# Patient Record
Sex: Male | Born: 1985 | Race: White | Hispanic: No | Marital: Single | Smoking: Current every day smoker
Health system: Southern US, Community
[De-identification: ages and names within clinical notes are randomized; demographics above are authoritative.]

## PROBLEM LIST (undated history)

## (undated) ENCOUNTER — Ambulatory Visit: Source: Home / Self Care

## (undated) DIAGNOSIS — C801 Malignant (primary) neoplasm, unspecified: Secondary | ICD-10-CM

---
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
## 2007-10-23 ENCOUNTER — Encounter (INDEPENDENT_AMBULATORY_CARE_PROVIDER_SITE_OTHER): Payer: Self-pay | Admitting: *Deleted

## 2007-10-23 ENCOUNTER — Observation Stay (HOSPITAL_COMMUNITY): Admission: EM | Admit: 2007-10-23 | Discharge: 2007-10-24 | Payer: Self-pay | Admitting: Emergency Medicine

## 2009-08-03 IMAGING — CT CT ABDOMEN W/ CM
1 of 3 series · 14 of 32 positions shown, 19 images · IV contrast (APPLIED)
Comparison: None

CT ABDOMEN

CLINICAL DATA: Right lower quadrant pain

CT ABDOMEN AND PELVIS WITH CONTRAST
TECHNIQUE: Multidetector CT imaging of the abdomen and pelvis was
performed using the standard protocol following bolus
administration of intravenous contrast.
Contrast: 100 ml [id number]

[Series 2: abd/pelv with 5.0 b31f st · axial · 0.65mm/px · z∈[-488,-98]mm · 14 of 88 slices shown, 19 images]
[im 5/88  soft-tissue]
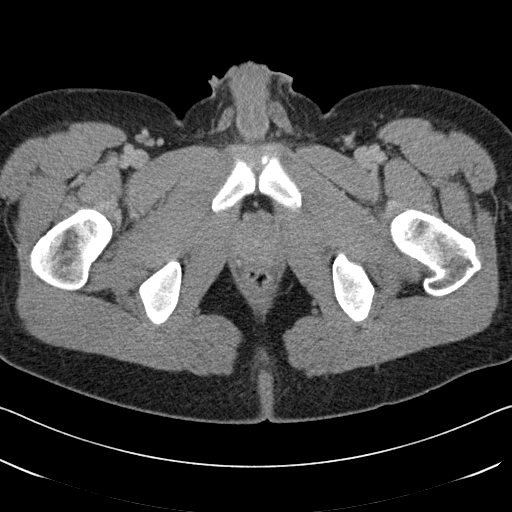
[im 5/88  bone]
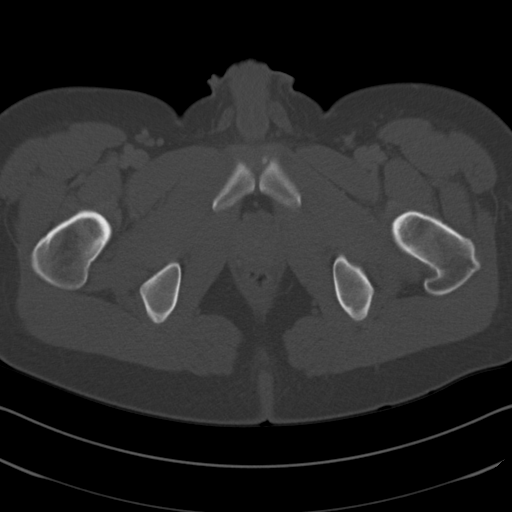
[im 14/88  soft-tissue]
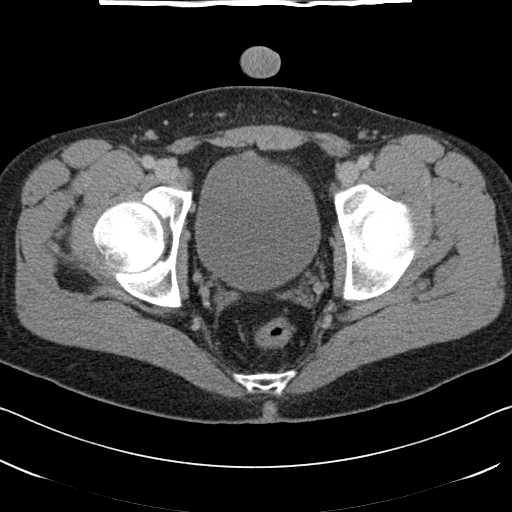
[im 19/88  soft-tissue]
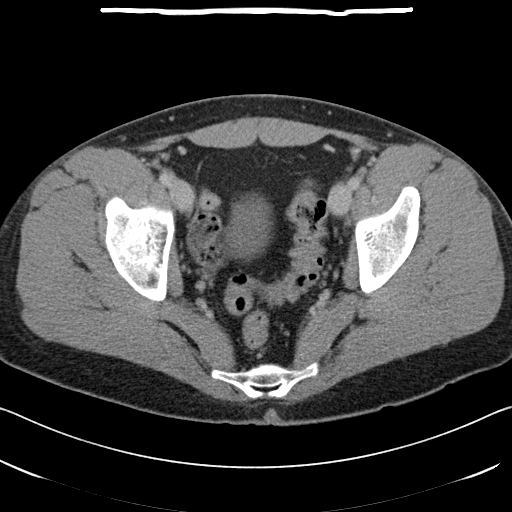
[im 23/88  soft-tissue]
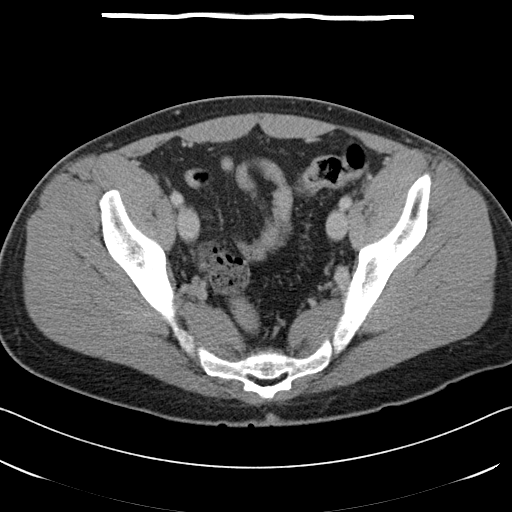
[im 33/88  soft-tissue]
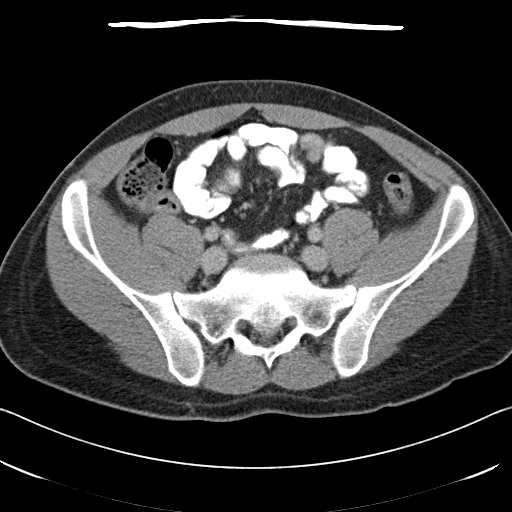
[im 37/88  soft-tissue]
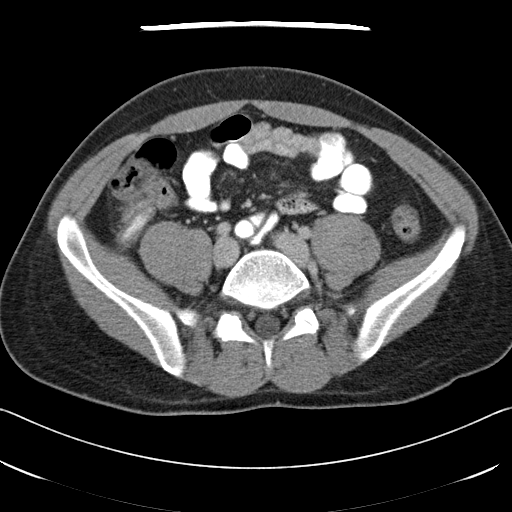
[im 46/88  soft-tissue]
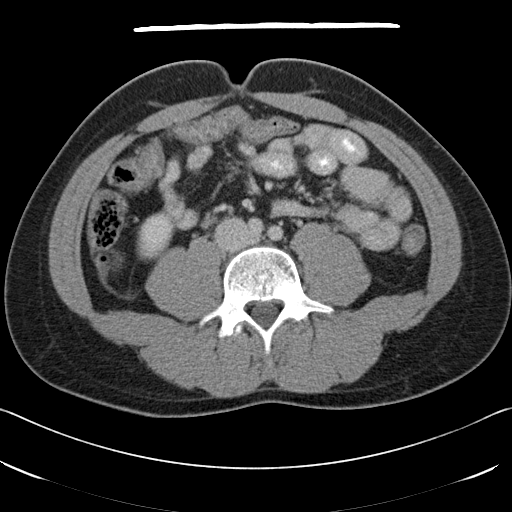
[im 51/88  soft-tissue]
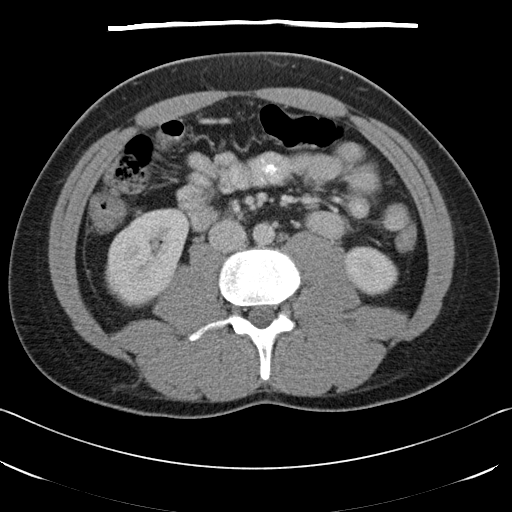
[im 55/88  soft-tissue]
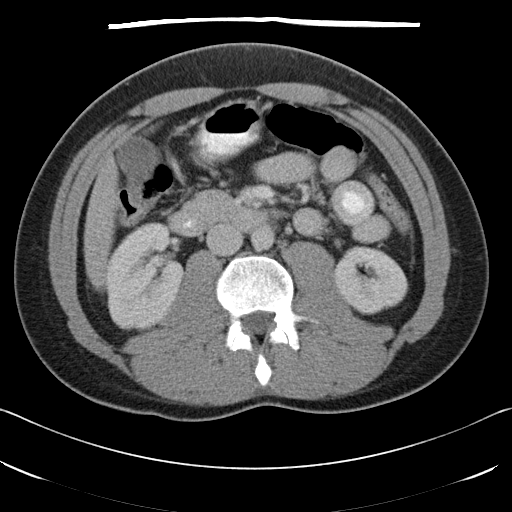
[im 55/88  bone]
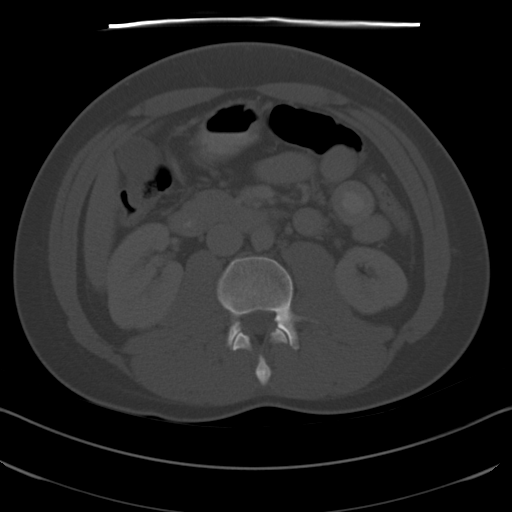
[im 65/88  soft-tissue]
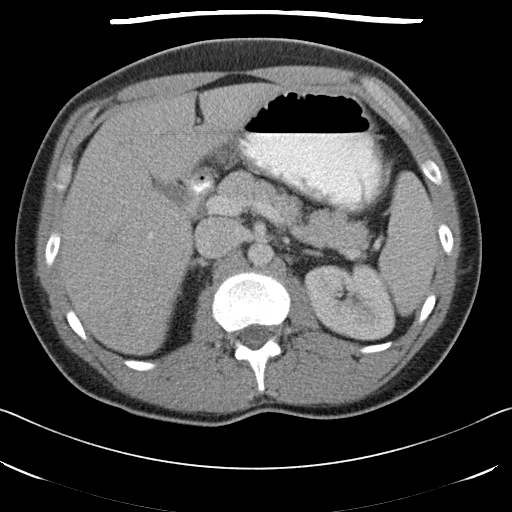
[im 69/88  soft-tissue]
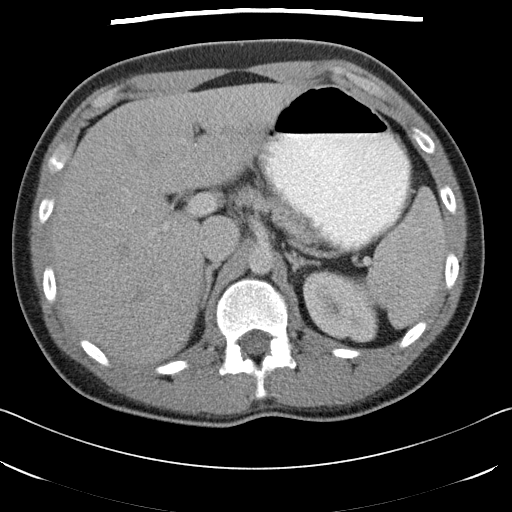
[im 69/88  lung]
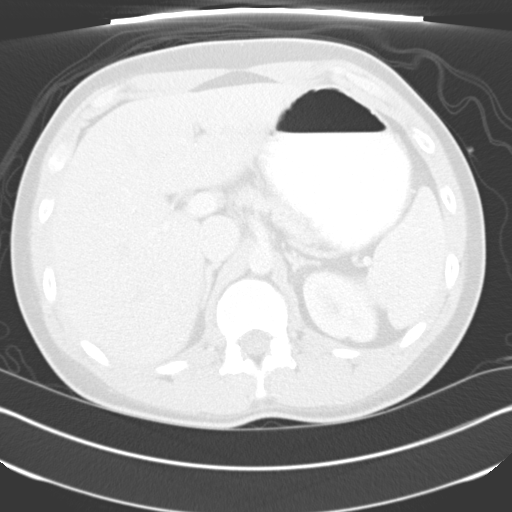
[im 74/88  soft-tissue]
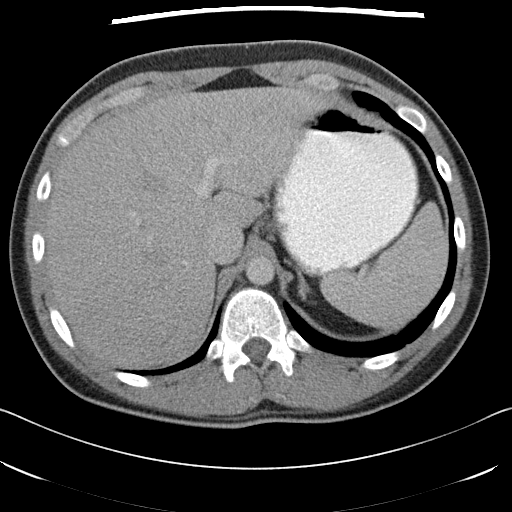
[im 74/88  lung]
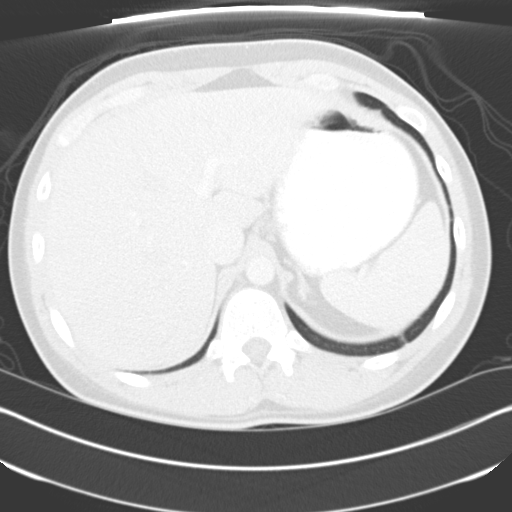
[im 78/88  lung]
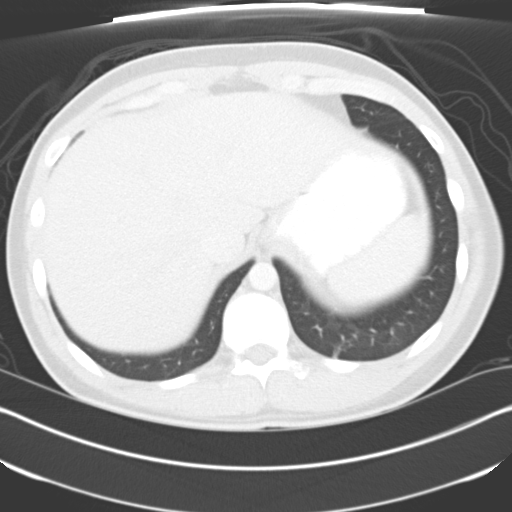
[im 83/88  soft-tissue]
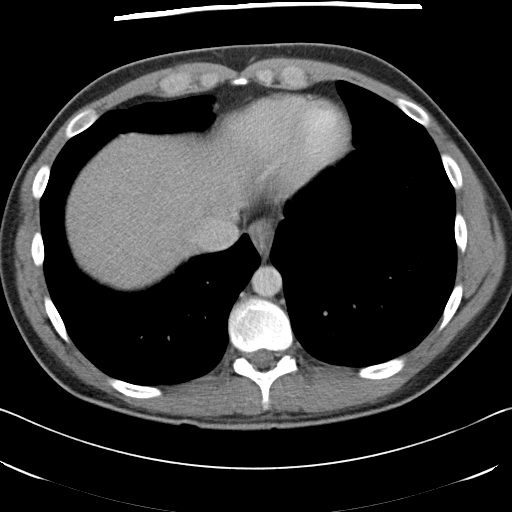
[im 83/88  lung]
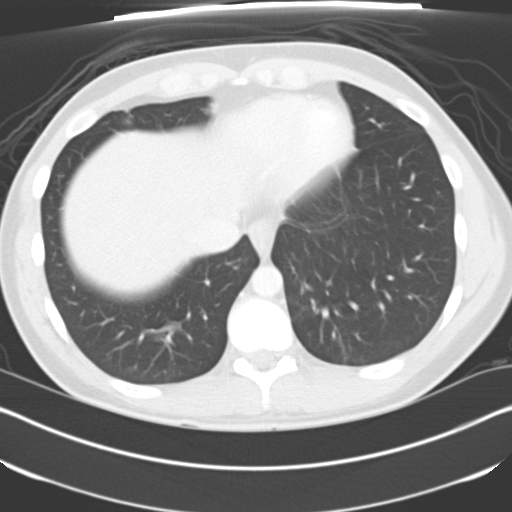

[14 of 32 positions shown; findings below may reference images not displayed]

FINDINGS: Lung bases are clear.  Liver, spleen, pancreas, and
adrenals normal.  No calcified gallstones or biliary dilatation.
No adenopathy or ascites.  Early and delayed images through the
kidneys normal.

The appendix is high in position.  It is thickened, enlarged, and
inflamed.  Within the there is radiodense material consistent with
appendicolith.  There is stranding in the periappendiceal fat but
no obvious perforation.  The findings are consistent with acute
appendicitis.
IMPRESSION: Acute appendicitis

CT PELVIS
FINDINGS: No focal masses, adenopathy, or fluid collections.  No
free air.
IMPRESSION: No acute or significant findings.

## 2010-10-27 NOTE — Op Note (Signed)
NAMEKHADEN, Travis Baird               ACCOUNT NO.:  [id number]   MEDICAL RECORD NO.:  [id number]          PATIENT TYPE:  INP   LOCATION:  5120                         FACILITY:  [facility]   PHYSICIAN:  Alfonse Ras, MD   DATE OF BIRTH:  [birth year]   DATE OF PROCEDURE:  10/23/2007  DATE OF DISCHARGE:                               OPERATIVE REPORT   PREOPERATIVE DIAGNOSIS:  Acute appendicitis.   POSTOPERATIVE DIAGNOSIS:  Acute appendicitis.   PROCEDURE:  Laparoscopic appendectomy.   ANESTHESIA:  General.   SURGEON:  Alfonse Ras, MD   INDICATIONS:  The patient is a 25 year old white male who presented to  the emergency room with acute appendicitis.  This was verified by CT  scan.  The patient also had an elevated white count.  He was prepared  for the operating room after the indication, risk, benefits, and options  were explained to the patient and his parents.   DESCRIPTION:  The patient was taken to the operating room and placed in  supine position.  After adequate general anesthesia was induced using  endotracheal tube, the abdomen was prepped and draped in normal sterile  fashion.  Using a transverse infraumbilical incision, I dissected down  to the fascia.  A vertical incision was made in the fascia.  An 0 Vicryl  pursestring suture was placed on the fascial defect.  Hassan trocar was  placed in the abdomen and pneumoperitoneum was obtained.  An additional  12-mm port was placed in the left lower quadrant and 5-mm port was  placed in the right upper abdomen.  This was done under direct vision.  The inflamed, enlarged appendix was easily visualized and was mobilized  off the peritoneal side wall.  The mesentery was taken down with a  harmonic scalpel and the base was transected using a blue load GIA 45-mm  stapler.  The right lower quadrant was copiously irrigated.  The  appendix was placed in EndoCatch bag and removed through the umbilical  port.  Pneumoperitoneum was  released.  Trocars were removed.  Skin  incisions were closed with subcuticular 4-0 Monocryl.  The left lower  quadrant fascial incision was closed with a figure-of-eight #1 Vicryl  suture.  Sterile dressings were applied.  The patient tolerated the  procedure well and went to PACU in good condition.      Alfonse Ras, MD  Electronically Signed     KRE/MEDQ  D:  10/23/2007  T:  10/24/2007  Job:  [id number]

## 2010-10-27 NOTE — H&P (Signed)
Travis Baird, Travis Baird               ACCOUNT NO.:  [id number]   MEDICAL RECORD NO.:  [id number]          PATIENT TYPE:  EMS   LOCATION:  MAJO                         FACILITY:  [facility]   PHYSICIAN:  Alfonse Ras, MD   DATE OF BIRTH:  01/03/1986   DATE OF ADMISSION:  10/23/2007  DATE OF DISCHARGE:                              HISTORY & PHYSICAL   DIAGNOSIS:  Acute appendicitis.   HISTORY OF PRESENT ILLNESS:  The patient is a 25 year old white male  with an 8-hour history of lower abdominal pain, now localized to the  right.  The patient had 3 episodes of emesis.  White count and ER was  22,000.  CT scan was consistent with acute appendicitis.   PAST MEDICAL HISTORY:  Significant only for asthma and treated with  p.r.n. albuterol.   PAST SURGICAL HISTORY:  Significant for tonsillectomy and right hand  surgery in the remote past.   REVIEW OF SYSTEMS:  Significant only as above.   PHYSICAL EXAMINATION:  GENERAL:  An age-appropriate white male in no  distress.  VITAL SIGNS:  His temperature is 97.6 and blood pressure 129/78.  HEENT:  Benign.  Normocephalic, atraumatic.  Pupils equal, round, and  reactive to light.  NECK:  Supple and soft without thyromegaly or cervical adenopathy.  LUNGS:  Clear to auscultation and percussion x2.  HEART:  Regular rate and rhythm without murmurs, rubs, or gallops.  ABDOMEN:  Soft, but tender in the right mid abdomen extended to the  right lower quadrant.  There is a positive Rovsing sign.  EXTREMITIES:  Show no clubbing, cyanosis, or edema.   IMPRESSION:  Acute appendicitis.   PLAN:  IV antibiotics and laparoscopic appendectomy later this morning.  I discussed the risks of bleeding, infection, converting into open with  the patient and his parents, and they wish to proceed.      Alfonse Ras, MD  Electronically Signed     KRE/MEDQ  D:  10/23/2007  T:  10/23/2007  Job:  [id number]

## 2010-10-30 NOTE — Discharge Summary (Signed)
[place]  Patient:    Travis Baird, Travis Baird                      MRN: [id number] Adm. Date:  [id number] Disc. Date: [id number] Attending:  Norman Clay Dictator:   Gillian Shields, M.D. CC:         [facility], Attn: Dr. Aggie Hacker   Discharge Summary  DATE OF BIRTH:  [birth year].  DISCHARGE DIAGNOSES: 1. Asthma exacerbation. 2. Atypical pneumonia. 3. Allergic rhinitis.  DISCHARGE MEDICATIONS: 1. Advair Diskus 100/50 mg one puff b.i.d. 2. Flonase two sprays each nostril q.d. 3. Albuterol metered-dose inhaler two puffs q.4-6h. p.r.n. use with    AeroChamber spacer.  HISTORY OF PRESENT ILLNESS:  This 25 year old male with a history of asthma since the age of 4 and multiple ER visits, two hospitalizations in the past year, presents with a three-day history gradual onset of wheezing, shortness of breath, and chest tightness, he has had no intubations and no ICU admissions in the past, complained of recent upper respiratory infection symptoms.  He uses an albuterol metered-dose inhaler for his symptoms but is not regularly on inhaled corticosteroids.  Flovent was prescribed but he states he lost his inhaler a while ago and had not replaced it.  The family was referred to Dr. Stefan Church, pulmonologist, approximately one year ago following hospital discharge.  The mother of the patient tells me that Dr. Stefan Church wanted to do allergy testing and they were not interested so they did not return for follow up.  Travis Baird does not know his best peak flows and does not check them regularly.  He states he does know how to use a peak flow meter.  He exhibited general lack of understanding of his disease at admission.  He reports that both of his parents are smokers and that they have a cat and two dogs in the home.  In addition, there is thick shag carpeting in his bedroom.  REVIEW OF SYSTEMS:  Otherwise negative.  PAST MEDICAL HISTORY:   Noncontributory except as above.  MEDICATIONS:  Albuterol metered-dose inhaler two puffs p.r.n. and Flovent inhaler not currently using.  ALLERGIES:  No known drug allergies.  SOCIAL HISTORY:  Travis Baird lives with his mother and stepfather.  He has three brothers and one stepsister.  There are two dogs and a cat.  Both parents smoke.  He is in the eighth grade and doing well.  FAMILY HISTORY:  Mother and two brothers, all with asthma.  PHYSICAL EXAMINATION:  VITAL SIGNS:  On admission, temperature is 100.2, heart rate 136, respiratory 24, blood pressure 131/85, oxygen saturations 91% on room air, weight is 51.8 kg.  GENERAL:  He is alert, appropriate, in no acute distress.  HEENT:  Normocephalic, atraumatic.  Extraocular movements intact.  Pupils equal, round, reactive to light, accommodating.  Oropharynx is clear.  Diffuse sinus tenderness over the frontal and maxillary sinuses bilaterally.  NECK:  Supple, no lymphadenopathy.  CARDIOVASCULAR:  Regular rhythm, tachycardic, no murmur.  CHEST:  Wheezing and rhonchi throughout, overall poor air movement.  ABDOMEN:  Soft, nontender, nondistended, positive bowel sounds, no hepatosplenomegaly.  EXTREMITIES:  2+ pulses, no clubbing, cyanosis, or edema.  SKIN:  Warm and dry with no rash.  IMAGING STUDIES:  Chest x-ray shows patchy bilateral infiltrates.  HOSPITAL COURSE:  #1 - ASTHMA EXACERBATION:  Travis Baird was admitted for q.2h. albuterol nebs and started on prednisone 2 mg/kg five-day burst.  For the  first three days of hospitalization, he required 2 to 3 L of oxygen to maintain his saturations above 93%.  During this time, his albuterol nebs were gradually spaced out however he continued to require them and his pulmonary exam was moderately improved but not back to baseline.  He performed pre and post peak flows with all his albuterol nebs and his best peak flow during this hospitalization was between 300 and 350.  He tells me  that he has never blown over 300 at home but does not check his peak flows regularly.  Both Travis Baird and his family had extensive asthma education while he was an inpatient.  We discussed asthma triggers at home and his mother has agreed to try quitting smoking in the interest of his health.  The family consented to a home asthma evaluation and has removed the shag carpeting from [place] bedroom.  At the time of discharge, he is provided with an asthma action plan which he is able to describe back to me verbally indicating his understanding and he is able to use all of his medications on his own unsupervised without difficulty.  He received the influenza vaccination prior to discharge.  #2 - ATYPICAL PNEUMONIA:  Travis Baird received five days of azithromycin.  He remained afebrile throughout his hospital course.  A repeat chest film on May 03, 2000 showed clearing of the left upper lobe infiltrate and a scant amount of right middle lobe atelectasis versus bronchopneumonia as he had been asymptomatic since admission.  No further antibiotic course was prescribed for him.  CONDITION ON DISCHARGE:  Good.  FOLLOW-UP:  [facility] with Dr. Aggie Hacker in one week.  Family has been instructed to record [place] peak flows at home and bring those numbers with them to his next visit. DD:  05/05/99 TD:  05/06/00 Job: [id number]

## 2011-06-10 ENCOUNTER — Other Ambulatory Visit: Payer: Self-pay | Admitting: Otolaryngology

## 2011-06-10 ENCOUNTER — Other Ambulatory Visit (HOSPITAL_COMMUNITY)
Admission: RE | Admit: 2011-06-10 | Discharge: 2011-06-10 | Disposition: A | Discharge status: Home / Self Care | Payer: BC Managed Care – PPO | Source: Ambulatory Visit | Attending: Otolaryngology | Admitting: Otolaryngology

## 2011-06-10 DIAGNOSIS — R220 Localized swelling, mass and lump, head: Secondary | ICD-10-CM | POA: Insufficient documentation

## 2015-11-20 ENCOUNTER — Ambulatory Visit: Admission: EM | Admit: 2015-11-20 | Discharge: 2015-11-20 | Discharge status: Absent without leave | Payer: Self-pay

## 2015-11-22 ENCOUNTER — Ambulatory Visit
Admission: EM | Admit: 2015-11-22 | Discharge: 2015-11-22 | Disposition: A | Discharge status: Home / Self Care | Payer: BLUE CROSS/BLUE SHIELD | Attending: Family Medicine | Admitting: Family Medicine

## 2015-11-22 ENCOUNTER — Encounter: Payer: Self-pay | Admitting: *Deleted

## 2015-11-22 DIAGNOSIS — B86 Scabies: Secondary | ICD-10-CM | POA: Diagnosis not present

## 2015-11-22 HISTORY — DX: Malignant (primary) neoplasm, unspecified: C80.1

## 2015-11-22 MED ORDER — PERMETHRIN 5 % EX CREA: TOPICAL_CREAM | CUTANEOUS | Status: AC

## 2015-11-22 NOTE — ED Notes (Signed)
Generalized itching and rash x2 weeks. Worse last few days.

## 2015-11-22 NOTE — Discharge Instructions (Signed)

## 2016-01-09 NOTE — ED Provider Notes (Signed)
[facility]    CSN: [id number] Arrival date & time: 11/22/15  [id number]  First Provider Contact:  First MD Initiated Contact with Patient 11/22/15 [id number]        History   Chief Complaint Chief Complaint  Patient presents with  . Pruritis  . Rash    HPI Travis Baird is a 30 y.o. male.   30 yo male with a 2 weeks h/o generalized rash extremely itchy all over her body.    The history is provided by the patient.    Past Medical History:  Diagnosis Date  . Cancer ([place])     There are no active problems to display for this patient.   History reviewed. No pertinent surgical history.     Home Medications    Prior to Admission medications   Medication Sig Start Date End Date Taking? Authorizing Provider  permethrin (ELIMITE) 5 % cream Apply to affected area once 11/22/15   Norval Gable, MD    Family History History reviewed. No pertinent family history.  Social History Social History  Substance Use Topics  . Smoking status: Current Every Day Smoker  . Smokeless tobacco: Not on file  . Alcohol use Yes     Allergies   Review of patient's allergies indicates no known allergies.   Review of Systems Review of Systems   Physical Exam Triage Vital Signs ED Triage Vitals [11/22/15 0830]  Enc Vitals Group     BP 132/83     Pulse Rate (!) 57     Resp 16     Temp 97.8 F (36.6 C)     Temp Source Oral     SpO2 98 %     Weight 165 lb (74.8 kg)     Height 6' (1.829 m)     Head Circumference      Peak Flow      Pain Score      Pain Loc      Pain Edu?      Excl. in [place]?    No data found.   Updated Vital Signs BP 132/83 (BP Location: Left Arm)   Pulse (!) 57   Temp 97.8 F (36.6 C) (Oral)   Resp 16   Ht 6' (1.829 m)   Wt 165 lb (74.8 kg)   SpO2 98%   BMI 22.38 kg/m   Visual Acuity Right Eye Distance:   Left Eye Distance:   Bilateral Distance:    Right Eye Near:   Left Eye Near:    Bilateral Near:     Physical Exam    Constitutional: He appears well-developed and well-nourished. No distress.  Skin: Rash noted. He is not diaphoretic. There is erythema.  Nursing note reviewed.    UC Treatments / Results  Labs (all labs ordered are listed, but only abnormal results are displayed) Labs Reviewed - No data to display  EKG  EKG Interpretation None       Radiology No results found.  Procedures Procedures (including critical care time)  Medications Ordered in UC Medications - No data to display   Initial Impression / Assessment and Plan / UC Course  I have reviewed the triage vital signs and the nursing notes.  Pertinent labs & imaging results that were available during my care of the patient were reviewed by me and considered in my medical decision making (see chart for details).  Clinical Course      Final Clinical Impressions(s) / UC Diagnoses   Final  diagnoses:  Scabies    New Prescriptions Discharge Medication List as of 11/22/2015  8:53 AM    START taking these medications   Details  permethrin (ELIMITE) 5 % cream Apply to affected area once, Normal       1. Labs/x-ray results and diagnosis reviewed with patient/parent/guardian/family 2. rx as per orders above; reviewed possible side effects, interactions, risks and benefits  3. Follow-up prn if symptoms worsen or don't improve   Norval Gable, MD 01/09/16 1209

## 2023-12-25 ENCOUNTER — Other Ambulatory Visit: Payer: Self-pay

## 2023-12-25 ENCOUNTER — Inpatient Hospital Stay
Admission: EM | Admit: 2023-12-25 | Discharge: 2023-12-25 | Discharge status: Home / Self Care | Payer: Self-pay | Attending: Internal Medicine | Admitting: Internal Medicine

## 2023-12-25 VITALS — BP 123/78 | HR 64 | Temp 98.7°F | Resp 18

## 2023-12-25 DIAGNOSIS — H66012 Acute suppurative otitis media with spontaneous rupture of ear drum, left ear: Secondary | ICD-10-CM

## 2023-12-25 MED ORDER — CEFDINIR 300 MG PO CAPS: 300.0000 mg | ORAL_CAPSULE | Freq: Two times a day (BID) | ORAL | 0 refills | Status: AC

## 2023-12-25 MED ORDER — OFLOXACIN 0.3 % OT SOLN: 5.0000 [drp] | Freq: Two times a day (BID) | OTIC | 0 refills | Status: AC

## 2023-12-25 NOTE — ED Triage Notes (Signed)
 Pt reports left ear has been draining x 2 weeks. Prior to this he had respiratory symptoms and ear pain and was prescribed augmentin several weeks ago from a virtual visit. Denies pain, denies fever at present. States ear feels full and clogged

## 2023-12-25 NOTE — Discharge Instructions (Addendum)
 Left ear irrigated and then curette used to remove as much residual substance from the left ear but still unable to fully visualize the tympanic membrane (eardrum).  The substance coming out of the ear does appear to be purulent suggesting a possible perforated tympanic membrane (eardrum) from a persistent ear infection.  Due to the findings we will treat for a possible perforated tympanic membrane (eardrum) from otitis media (ear infection).  We will treat with the following: Cefdinir  (Omnicef ) 300 mg 1 capsule 2 times daily for 10 days.  This is an antibiotic.  Take this with food. Ofloxacin  5 drops in the left ear twice daily for 10 days Recommend returning to urgent care in approximately a week for recheck of the left ear since we are unable to fully visualize the tympanic membrane. Return to urgent care if symptoms worsen

## 2023-12-25 NOTE — ED Provider Notes (Signed)
 [facility]    CSN: [id number] Arrival date & time: 12/25/23  1339      History   Chief Complaint Chief Complaint  Patient presents with   Otalgia    HPI Travis Baird is a 38 y.o. male.   38 year old male who presents urgent care with complaints of left ear drainage.  He reports that about 2 weeks ago he had an upper respiratory infection and was prescribed Augmentin over virtual visit.  He did have some ear pain at that time.  He finished his antibiotics and has started to have drainage from his left ear.  He no longer has any ear pain.  He reports that he has some muffled hearing as well but reports that if he moves his ear a certain way he is able to hear a little bit better.  He denies any fevers or chills.  He denies any congestion, cough, sore throat, ear pain.   Otalgia Associated symptoms: ear discharge   Associated symptoms: no abdominal pain, no cough, no fever, no rash, no sore throat and no vomiting     Past Medical History:  Diagnosis Date   Cancer (HCC)     There are no active problems to display for this patient.   No past surgical history on file.     Home Medications    Prior to Admission medications   Medication Sig Start Date End Date Taking? Authorizing Provider  cefdinir  (OMNICEF ) 300 MG capsule Take 1 capsule (300 mg total) by mouth 2 (two) times daily for 10 days. 12/25/23 01/04/24 Yes Clayborn Milnes A, PA-C  ofloxacin  (FLOXIN ) 0.3 % OTIC solution Place 5 drops into the left ear 2 (two) times daily. For 10 days 12/25/23  Yes Teresa Almarie LABOR, PA-C  permethrin  (ELIMITE ) 5 % cream Apply to affected area once Patient not taking: Reported on 12/25/2023 11/22/15   Servando Hire, MD    Family History No family history on file.  Social History Social History   Tobacco Use   Smoking status: Every Day    Types: Cigarettes  Vaping Use   Vaping status: Every Day  Substance Use Topics   Alcohol use: Yes    Comment: occasional    Drug use: Not Currently    Types: Marijuana     Allergies   Patient has no known allergies.   Review of Systems Review of Systems  Constitutional:  Negative for chills and fever.  HENT:  Positive for ear discharge. Negative for ear pain and sore throat.   Eyes:  Negative for pain and visual disturbance.  Respiratory:  Negative for cough and shortness of breath.   Cardiovascular:  Negative for chest pain and palpitations.  Gastrointestinal:  Negative for abdominal pain and vomiting.  Genitourinary:  Negative for dysuria and hematuria.  Musculoskeletal:  Negative for arthralgias and back pain.  Skin:  Negative for color change and rash.  Neurological:  Negative for seizures and syncope.  All other systems reviewed and are negative.    Physical Exam Triage Vital Signs ED Triage Vitals  Encounter Vitals Group     BP 12/25/23 1354 123/78     Girls Systolic BP Percentile --      Girls Diastolic BP Percentile --      Boys Systolic BP Percentile --      Boys Diastolic BP Percentile --      Pulse Rate 12/25/23 1354 64     Resp 12/25/23 1354 18     Temp  12/25/23 1354 98.7 F (37.1 C)     Temp Source 12/25/23 1354 Oral     SpO2 12/25/23 1354 98 %     Weight --      Height --      Head Circumference --      Peak Flow --      Pain Score 12/25/23 1351 0     Pain Loc --      Pain Education --      Exclude from Growth Chart --    No data found.  Updated Vital Signs BP 123/78 (BP Location: Left Arm)   Pulse 64   Temp 98.7 F (37.1 C) (Oral)   Resp 18   SpO2 98%   Visual Acuity Right Eye Distance:   Left Eye Distance:   Bilateral Distance:    Right Eye Near:   Left Eye Near:    Bilateral Near:     Physical Exam Vitals and nursing note reviewed.  Constitutional:      General: He is not in acute distress.    Appearance: He is well-developed.  HENT:     Head: Normocephalic and atraumatic.     Right Ear: Tympanic membrane and ear canal normal.     Left Ear:  There is impacted cerumen.  Eyes:     Conjunctiva/sclera: Conjunctivae normal.  Cardiovascular:     Rate and Rhythm: Normal rate and regular rhythm.     Heart sounds: No murmur heard. Pulmonary:     Effort: Pulmonary effort is normal. No respiratory distress.     Breath sounds: Normal breath sounds.  Abdominal:     Palpations: Abdomen is soft.     Tenderness: There is no abdominal tenderness.  Musculoskeletal:        General: No swelling.     Cervical back: Neck supple.  Skin:    General: Skin is warm and dry.     Capillary Refill: Capillary refill takes less than 2 seconds.  Neurological:     Mental Status: He is alert.  Psychiatric:        Mood and Affect: Mood normal.      UC Treatments / Results  Labs (all labs ordered are listed, but only abnormal results are displayed) Labs Reviewed - No data to display  EKG   Radiology No results found.  Procedures Procedures (including critical care time)  Medications Ordered in UC Medications - No data to display  Initial Impression / Assessment and Plan / UC Course  I have reviewed the triage vital signs and the nursing notes.  Pertinent labs & imaging results that were available during my care of the patient were reviewed by me and considered in my medical decision making (see chart for details).     Non-recurrent acute suppurative otitis media of left ear with spontaneous rupture of tympanic membrane   Left ear irrigated and then curette used to remove as much residual substance from the left ear but still unable to fully visualize the tympanic membrane (eardrum).  The substance coming out of the ear does appear to be purulent suggesting a possible perforated tympanic membrane (eardrum) from a persistent ear infection.  Due to the findings we will treat for a possible perforated tympanic membrane (eardrum) from otitis media (ear infection).  This is also occurring after treatment with Augmentin.  We will treat with the  following: Cefdinir  (Omnicef ) 300 mg 1 capsule 2 times daily for 10 days.  This is an antibiotic.  Take this  with food. Ofloxacin  5 drops in the left ear twice daily for 10 days Recommend returning to urgent care in approximately a week for recheck of the left ear since we are unable to fully visualize the tympanic membrane. Return to urgent care if symptoms worsen  Final Clinical Impressions(s) / UC Diagnoses   Final diagnoses:  Non-recurrent acute suppurative otitis media of left ear with spontaneous rupture of tympanic membrane     Discharge Instructions      Left ear irrigated and then curette used to remove as much residual substance from the left ear but still unable to fully visualize the tympanic membrane (eardrum).  The substance coming out of the ear does appear to be purulent suggesting a possible perforated tympanic membrane (eardrum) from a persistent ear infection.  Due to the findings we will treat for a possible perforated tympanic membrane (eardrum) from otitis media (ear infection).  We will treat with the following: Cefdinir  (Omnicef ) 300 mg 1 capsule 2 times daily for 10 days.  This is an antibiotic.  Take this with food. Ofloxacin  5 drops in the left ear twice daily for 10 days Recommend returning to urgent care in approximately a week for recheck of the left ear since we are unable to fully visualize the tympanic membrane. Return to urgent care if symptoms worsen     ED Prescriptions     Medication Sig Dispense Auth. Provider   cefdinir  (OMNICEF ) 300 MG capsule Take 1 capsule (300 mg total) by mouth 2 (two) times daily for 10 days. 20 capsule Yashar Inclan A, PA-C   ofloxacin  (FLOXIN ) 0.3 % OTIC solution Place 5 drops into the left ear 2 (two) times daily. For 10 days 5 mL Teresa Almarie LABOR, [place]      PDMP not reviewed this encounter.   Teresa Almarie LABOR, PA-C 12/25/23 1455
